# Patient Record
Sex: Female | Born: 1975 | Race: Black or African American | Hispanic: No | Marital: Single | Smoking: Current every day smoker
Health system: Southern US, Community
[De-identification: ages and names within clinical notes are randomized; demographics above are authoritative.]

## PROBLEM LIST (undated history)

## (undated) DIAGNOSIS — Z789 Other specified health status: Secondary | ICD-10-CM

## (undated) HISTORY — PX: OTHER SURGICAL HISTORY: SHX169

## (undated) HISTORY — PX: TUBAL LIGATION: SHX77

---

## 1998-01-02 ENCOUNTER — Ambulatory Visit (HOSPITAL_COMMUNITY): Admission: RE | Admit: 1998-01-02 | Discharge: 1998-01-02 | Payer: Self-pay | Admitting: Obstetrics

## 1998-01-31 ENCOUNTER — Ambulatory Visit (HOSPITAL_COMMUNITY): Admission: RE | Admit: 1998-01-31 | Discharge: 1998-01-31 | Payer: Self-pay | Admitting: Obstetrics

## 1998-03-26 ENCOUNTER — Other Ambulatory Visit: Admission: RE | Admit: 1998-03-26 | Discharge: 1998-03-26 | Payer: Self-pay | Admitting: Obstetrics

## 1998-05-02 ENCOUNTER — Inpatient Hospital Stay (HOSPITAL_COMMUNITY): Admission: AD | Admit: 1998-05-02 | Discharge: 1998-05-04 | Payer: Self-pay | Admitting: Obstetrics

## 2000-07-23 ENCOUNTER — Encounter: Payer: Self-pay | Admitting: *Deleted

## 2000-07-23 ENCOUNTER — Emergency Department (HOSPITAL_COMMUNITY): Admission: EM | Admit: 2000-07-23 | Discharge: 2000-07-23 | Payer: Self-pay | Admitting: Emergency Medicine

## 2001-08-27 ENCOUNTER — Emergency Department (HOSPITAL_COMMUNITY): Admission: EM | Admit: 2001-08-27 | Discharge: 2001-08-27 | Payer: Self-pay | Admitting: Emergency Medicine

## 2004-03-13 ENCOUNTER — Inpatient Hospital Stay (HOSPITAL_COMMUNITY): Admission: AD | Admit: 2004-03-13 | Discharge: 2004-03-13 | Payer: Self-pay | Admitting: Obstetrics

## 2004-03-25 ENCOUNTER — Inpatient Hospital Stay (HOSPITAL_COMMUNITY): Admission: RE | Admit: 2004-03-25 | Discharge: 2004-03-25 | Payer: Self-pay | Admitting: Obstetrics & Gynecology

## 2004-04-09 ENCOUNTER — Ambulatory Visit (HOSPITAL_COMMUNITY): Admission: RE | Admit: 2004-04-09 | Discharge: 2004-04-09 | Payer: Self-pay | Admitting: *Deleted

## 2004-06-09 ENCOUNTER — Inpatient Hospital Stay (HOSPITAL_COMMUNITY): Admission: AD | Admit: 2004-06-09 | Discharge: 2004-06-09 | Payer: Self-pay | Admitting: Family Medicine

## 2004-10-10 ENCOUNTER — Inpatient Hospital Stay (HOSPITAL_COMMUNITY): Admission: AD | Admit: 2004-10-10 | Discharge: 2004-10-13 | Payer: Self-pay | Admitting: Obstetrics

## 2006-12-30 ENCOUNTER — Emergency Department (HOSPITAL_COMMUNITY): Admission: EM | Admit: 2006-12-30 | Discharge: 2006-12-30 | Payer: Self-pay | Admitting: Emergency Medicine

## 2010-11-30 ENCOUNTER — Encounter: Payer: Self-pay | Admitting: *Deleted

## 2011-03-20 ENCOUNTER — Emergency Department (HOSPITAL_COMMUNITY)
Admission: EM | Admit: 2011-03-20 | Discharge: 2011-03-20 | Disposition: A | Discharge status: Home / Self Care | Payer: Self-pay | Attending: Emergency Medicine | Admitting: Emergency Medicine

## 2011-03-20 DIAGNOSIS — R599 Enlarged lymph nodes, unspecified: Secondary | ICD-10-CM | POA: Insufficient documentation

## 2011-03-20 DIAGNOSIS — K029 Dental caries, unspecified: Secondary | ICD-10-CM | POA: Insufficient documentation

## 2011-03-20 DIAGNOSIS — R6884 Jaw pain: Secondary | ICD-10-CM | POA: Insufficient documentation

## 2011-03-20 DIAGNOSIS — K047 Periapical abscess without sinus: Secondary | ICD-10-CM | POA: Insufficient documentation

## 2011-03-20 DIAGNOSIS — K089 Disorder of teeth and supporting structures, unspecified: Secondary | ICD-10-CM | POA: Insufficient documentation

## 2011-03-20 DIAGNOSIS — R220 Localized swelling, mass and lump, head: Secondary | ICD-10-CM | POA: Insufficient documentation

## 2013-04-05 ENCOUNTER — Other Ambulatory Visit (HOSPITAL_COMMUNITY): Payer: Self-pay | Admitting: Obstetrics

## 2013-04-05 DIAGNOSIS — N912 Amenorrhea, unspecified: Secondary | ICD-10-CM

## 2013-04-05 DIAGNOSIS — Z1231 Encounter for screening mammogram for malignant neoplasm of breast: Secondary | ICD-10-CM

## 2013-04-20 ENCOUNTER — Ambulatory Visit (HOSPITAL_COMMUNITY)
Admission: RE | Admit: 2013-04-20 | Discharge: 2013-04-20 | Disposition: A | Discharge status: Home / Self Care | Payer: Medicaid Other | Source: Ambulatory Visit | Attending: Obstetrics | Admitting: Obstetrics

## 2013-04-20 DIAGNOSIS — N949 Unspecified condition associated with female genital organs and menstrual cycle: Secondary | ICD-10-CM | POA: Insufficient documentation

## 2013-04-20 DIAGNOSIS — D259 Leiomyoma of uterus, unspecified: Secondary | ICD-10-CM | POA: Insufficient documentation

## 2013-04-20 DIAGNOSIS — N912 Amenorrhea, unspecified: Secondary | ICD-10-CM | POA: Insufficient documentation

## 2013-04-20 DIAGNOSIS — N854 Malposition of uterus: Secondary | ICD-10-CM | POA: Insufficient documentation

## 2013-04-20 DIAGNOSIS — Z1231 Encounter for screening mammogram for malignant neoplasm of breast: Secondary | ICD-10-CM | POA: Insufficient documentation

## 2016-07-18 LAB — PROCEDURE REPORT - SCANNED: Pap: NEGATIVE

## 2019-02-27 ENCOUNTER — Other Ambulatory Visit: Payer: Self-pay

## 2019-02-27 ENCOUNTER — Ambulatory Visit (HOSPITAL_COMMUNITY)
Admission: EM | Admit: 2019-02-27 | Discharge: 2019-02-27 | Disposition: A | Discharge status: Home / Self Care | Payer: Self-pay | Attending: Family Medicine | Admitting: Family Medicine

## 2019-02-27 ENCOUNTER — Encounter (HOSPITAL_COMMUNITY): Payer: Self-pay

## 2019-02-27 DIAGNOSIS — K1120 Sialoadenitis, unspecified: Secondary | ICD-10-CM

## 2019-02-27 MED ORDER — CEPHALEXIN 500 MG PO CAPS: 500.0000 mg | ORAL_CAPSULE | Freq: Two times a day (BID) | ORAL | 0 refills | Status: DC

## 2019-02-27 NOTE — ED Triage Notes (Signed)
Pt states she has a swollen gland on the left side of her neck x 3 weeks. Pt states she has been taking tylenol and ibuprofen. and amoxcillon 500 mg  For 3 days.

## 2019-02-27 NOTE — ED Provider Notes (Signed)
[name]    CSN: [id number] Arrival date & time: 02/27/19  1545     History   Chief Complaint Chief Complaint  Patient presents with  . Facial Swelling    HPI Rachael [name] is a 43 y.o. female.   She is presenting with left submandibular swelling and pain.  This is been ongoing for 3 weeks.  Denies any fevers or chills.  Does have pain in this area.  Denies any trouble swallowing or breathing.  Has been taking some of her nieces amoxicillin with no improvement.  Has done this for the past 3 days.  No history of similar instances.  HPI  History reviewed. No pertinent past medical history.  There are no active problems to display for this patient.   History reviewed. No pertinent surgical history.  OB History   No obstetric history on file.      Home Medications    Prior to Admission medications   Medication Sig Start Date End Date Taking? Authorizing Provider  cephALEXin (KEFLEX) 500 MG capsule Take 1 capsule (500 mg total) by mouth 2 (two) times daily. 02/27/19   Rosemarie Ax, MD    Family History Family History  Problem Relation Age of Onset  . Stroke Mother   . Hypertension Mother   . Cancer Father   . Hypertension Father     Social History Social History   Tobacco Use  . Smoking status: Current Every Day Smoker    Types: Cigarettes  . Smokeless tobacco: Never Used  Substance Use Topics  . Alcohol use: Never    Frequency: Never  . Drug use: Never     Allergies   Patient has no known allergies.   Review of Systems Review of Systems  Constitutional: Negative for fever.  HENT: Positive for facial swelling. Negative for congestion.   Respiratory: Negative for shortness of breath.   Cardiovascular: Negative for chest pain.  Gastrointestinal: Negative for abdominal pain.  Musculoskeletal: Negative for back pain.  Skin: Negative for color change.  Neurological: Negative for weakness.  Hematological: Negative for adenopathy.     Physical Exam Triage Vital Signs ED Triage Vitals  Enc Vitals Group     BP 02/27/19 1603 103/64     Pulse Rate 02/27/19 1603 75     Resp 02/27/19 1603 16     Temp 02/27/19 1603 98.7 F (37.1 C)     Temp Source 02/27/19 1603 Oral     SpO2 02/27/19 1603 100 %     Weight 02/27/19 1601 115 lb (52.2 kg)     Height --      Head Circumference --      Peak Flow --      Pain Score 02/27/19 1600 8     Pain Loc --      Pain Edu? --      Excl. in [place]? --    No data found.  Updated Vital Signs BP 103/64 (BP Location: Right Arm)   Pulse 75   Temp 98.7 F (37.1 C) (Oral)   Resp 16   Wt 52.2 kg   SpO2 100%   Visual Acuity Right Eye Distance:   Left Eye Distance:   Bilateral Distance:    Right Eye Near:   Left Eye Near:    Bilateral Near:     Physical Exam Gen: NAD, alert, cooperative with exam, well-appearing ENT: normal lips, normal nasal mucosa, tenderness to palpation over the left submandibular gland with fullness.  No  expression of pus or fluid.  No overlying streaking or redness. Eye: normal EOM, normal conjunctiva and lids CV:  no edema, +2 pedal pulses   Resp: no accessory muscle use, non-labored,  Skin: no rashes, no areas of induration  Neuro: normal tone, normal sensation to touch Psych:  normal insight, alert and oriented MSK: normal gait, normal strength    UC Treatments / Results  Labs (all labs ordered are listed, but only abnormal results are displayed) Labs Reviewed - No data to display  EKG None  Radiology No results found.  Procedures Procedures (including critical care time)  Medications Ordered in UC Medications - No data to display  Initial Impression / Assessment and Plan / UC Course  I have reviewed the triage vital signs and the nursing notes.  Pertinent labs & imaging results that were available during my care of the patient were reviewed by me and considered in my medical decision making (see chart for details).     [name] is a  43 year old female that is presenting with left sialadenitis.  Will provide with Keflex.  Counseled on supportive care.  Given indications to follow-up and return.  Final Clinical Impressions(s) / UC Diagnoses   Final diagnoses:  Sialadenitis     Discharge Instructions     Please take the antibiotic  Please follow up if your symptoms fail to improve.     ED Prescriptions    Medication Sig Dispense Auth. Provider   cephALEXin (KEFLEX) 500 MG capsule Take 1 capsule (500 mg total) by mouth 2 (two) times daily. 14 capsule Rosemarie Ax, MD     Controlled Substance Prescriptions [place] Controlled Substance Registry consulted? Not Applicable   Rosemarie Ax, MD 02/27/19 [phone]

## 2019-02-27 NOTE — Discharge Instructions (Signed)
Please take the antibiotic  Please follow up if your symptoms fail to improve.

## 2019-03-06 ENCOUNTER — Telehealth (HOSPITAL_COMMUNITY): Payer: Self-pay | Admitting: Emergency Medicine

## 2019-03-06 ENCOUNTER — Ambulatory Visit (HOSPITAL_COMMUNITY)
Admission: EM | Admit: 2019-03-06 | Discharge: 2019-03-06 | Disposition: A | Discharge status: Home / Self Care | Payer: Self-pay | Attending: Family Medicine | Admitting: Family Medicine

## 2019-03-06 ENCOUNTER — Encounter (HOSPITAL_COMMUNITY): Payer: Self-pay

## 2019-03-06 ENCOUNTER — Other Ambulatory Visit: Payer: Self-pay

## 2019-03-06 DIAGNOSIS — R220 Localized swelling, mass and lump, head: Secondary | ICD-10-CM | POA: Insufficient documentation

## 2019-03-06 LAB — CBC WITH DIFFERENTIAL/PLATELET
Abs Immature Granulocytes: 0.01 10*3/uL (ref 0.00–0.07)
Basophils Absolute: 0.1 10*3/uL (ref 0.0–0.1)
Basophils Relative: 1 %
Eosinophils Absolute: 0.1 10*3/uL (ref 0.0–0.5)
Eosinophils Relative: 2 %
HCT: 38.5 % (ref 36.0–46.0)
Hemoglobin: 12.6 g/dL (ref 12.0–15.0)
Immature Granulocytes: 0 %
Lymphocytes Relative: 32 %
Lymphs Abs: 1.7 10*3/uL (ref 0.7–4.0)
MCH: 27.8 pg (ref 26.0–34.0)
MCHC: 32.7 g/dL (ref 30.0–36.0)
MCV: 84.8 fL (ref 80.0–100.0)
Monocytes Absolute: 0.4 10*3/uL (ref 0.1–1.0)
Monocytes Relative: 8 %
Neutro Abs: 2.9 10*3/uL (ref 1.7–7.7)
Neutrophils Relative %: 57 %
Platelets: 211 10*3/uL (ref 150–400)
RBC: 4.54 MIL/uL (ref 3.87–5.11)
RDW: 15.9 % — ABNORMAL HIGH (ref 11.5–15.5)
WBC: 5.2 10*3/uL (ref 4.0–10.5)
nRBC: 0 % (ref 0.0–0.2)

## 2019-03-06 NOTE — Telephone Encounter (Signed)
Normal labs. Attempted to reach patient. No answer at this time.

## 2019-03-06 NOTE — Telephone Encounter (Signed)
Patient contacted and made aware of all results, all questions answered.   

## 2019-03-06 NOTE — ED Triage Notes (Signed)
Pt cc she is still having neck pain. Pt states she has been taking her Cephalexin. Pt states it's not working.

## 2019-03-06 NOTE — ED Provider Notes (Signed)
[place]   [id number] 03/06/19 Arrival Time: 9833  ASSESSMENT & PLAN:  1. Mass of left submandibular region    Recommend: Follow-up Information    [facility].   Contact information: [place] [address] [phone]          Pending: Labs Reviewed  CBC WITH DIFFERENTIAL/PLATELET  Will notify her of any significant results.  OTC symptom care as needed. May f/u with PCP or here as needed.  Reviewed expectations re: course of current medical issues. Questions answered. Outlined signs and symptoms indicating need for more acute intervention. Patient verbalized understanding. After Visit Summary given.   SUBJECTIVE: History from: patient.  Rachael [name] is a 43 y.o. female who was seen here on 02/27/2019. Placed on Keflex for suspected sialadenitis. Questions mild help initially but "now it really hasn't changed". L-sided. No specific enlargement of area. Is tender. Afebrile. No swallowing or speaking difficulties. No specific aggravating or alleviating factors reported. Normal appetite and PO intake without n/v. Weight stable. Otherwise feeling well. No PMH of similar. Overall she feels this has been present for at least 1.5 months. No OTC treatment.  Social History   Tobacco Use  Smoking Status Current Every Day Smoker  . Types: Cigarettes  Smokeless Tobacco Never Used   ROS: As per HPI. All other systems negative.    OBJECTIVE:  Vitals:   03/06/19 0902 03/06/19 0903  BP:  (!) 140/97  Pulse:  83  Resp:  16  Temp:  98.5 F (36.9 C)  TempSrc:  Oral  SpO2:  98%  Weight: 52.2 kg     General appearance: alert; no distress HEENT: oropharynx normal Neck: supple with FROM; L submandibular soft/rubbery mass measuring approx 1-1.5 cm by < 1 cm; tenderness to palpation reported CV: RRR Lungs: unlabored respirations, symmetrical air entry without wheezing; cough: absent Abd: soft Ext: no LE  edema Skin: warm and dry Psychological: alert and cooperative; normal mood and affect  No Known Allergies  Family History  Problem Relation Age of Onset  . Stroke Mother   . Hypertension Mother   . Cancer Father   . Hypertension Father    Social History   Socioeconomic History  . Marital status: Single    Spouse name: Not on file  . Number of children: Not on file  . Years of education: Not on file  . Highest education level: Not on file  Occupational History  . Not on file  Social Needs  . Financial resource strain: Not on file  . Food insecurity:    Worry: Not on file    Inability: Not on file  . Transportation needs:    Medical: Not on file    Non-medical: Not on file  Tobacco Use  . Smoking status: Current Every Day Smoker    Types: Cigarettes  . Smokeless tobacco: Never Used  Substance and Sexual Activity  . Alcohol use: Never    Frequency: Never  . Drug use: Never  . Sexual activity: Yes  Lifestyle  . Physical activity:    Days per week: Not on file    Minutes per session: Not on file  . Stress: Not on file  Relationships  . Social connections:    Talks on phone: Not on file    Gets together: Not on file    Attends religious service: Not on file    Active member of club or organization: Not on file  Attends meetings of clubs or organizations: Not on file    Relationship status: Not on file  . Intimate partner violence:    Fear of current or ex partner: Not on file    Emotionally abused: Not on file    Physically abused: Not on file    Forced sexual activity: Not on file  Other Topics Concern  . Not on file  Social History Narrative  . Not on file           Vanessa Kick, MD 03/06/19 [phone]

## 2019-03-07 ENCOUNTER — Other Ambulatory Visit: Payer: Self-pay | Admitting: Otolaryngology

## 2019-03-07 DIAGNOSIS — K118 Other diseases of salivary glands: Secondary | ICD-10-CM

## 2019-03-07 DIAGNOSIS — R6884 Jaw pain: Principal | ICD-10-CM

## 2019-03-13 ENCOUNTER — Ambulatory Visit
Admission: RE | Admit: 2019-03-13 | Discharge: 2019-03-13 | Disposition: A | Discharge status: Home / Self Care | Payer: No Typology Code available for payment source | Source: Ambulatory Visit | Attending: Otolaryngology | Admitting: Otolaryngology

## 2019-03-13 ENCOUNTER — Other Ambulatory Visit: Payer: Self-pay

## 2019-03-13 DIAGNOSIS — R6884 Jaw pain: Principal | ICD-10-CM

## 2019-03-13 DIAGNOSIS — K118 Other diseases of salivary glands: Secondary | ICD-10-CM

## 2019-03-13 MED ORDER — IOPAMIDOL (ISOVUE-300) INJECTION 61%
75.0000 mL | Freq: Once | INTRAVENOUS | Status: AC | PRN
  Administered 2019-03-13: 75 mL via INTRAVENOUS

## 2019-03-17 ENCOUNTER — Other Ambulatory Visit: Payer: Self-pay

## 2019-03-17 ENCOUNTER — Other Ambulatory Visit: Payer: Self-pay | Admitting: Otolaryngology

## 2019-03-17 ENCOUNTER — Encounter (HOSPITAL_COMMUNITY): Payer: Self-pay | Admitting: *Deleted

## 2019-03-17 NOTE — Progress Notes (Addendum)
Spoke with pt for pre-op call. Pt denies cardiac history, HTN or Diabetes.    Coronavirus Screening  Have you experienced the following symptoms:  Cough NO Fever (>100.23F) NO Runny nose NO Sore throat NO Difficulty breathing/shortness of breath  NO  Have you or a family member traveled in the last 14 days and where? NO     Patient reminded that hospital visitation restrictions are in effect and the importance of the restrictions.

## 2019-03-20 ENCOUNTER — Ambulatory Visit (HOSPITAL_COMMUNITY)
Admission: RE | Admit: 2019-03-20 | Discharge: 2019-03-20 | Disposition: A | Discharge status: Home / Self Care | Payer: Self-pay | Attending: Otolaryngology | Admitting: Otolaryngology

## 2019-03-20 ENCOUNTER — Encounter (HOSPITAL_COMMUNITY)
Admission: RE | Disposition: A | Discharge status: Home / Self Care | Payer: Self-pay | Source: Home / Self Care | Attending: Otolaryngology

## 2019-03-20 ENCOUNTER — Encounter (HOSPITAL_COMMUNITY): Payer: Self-pay

## 2019-03-20 ENCOUNTER — Other Ambulatory Visit: Payer: Self-pay

## 2019-03-20 ENCOUNTER — Ambulatory Visit (HOSPITAL_COMMUNITY): Payer: Self-pay | Admitting: Anesthesiology

## 2019-03-20 DIAGNOSIS — Z1159 Encounter for screening for other viral diseases: Secondary | ICD-10-CM | POA: Insufficient documentation

## 2019-03-20 DIAGNOSIS — F17210 Nicotine dependence, cigarettes, uncomplicated: Secondary | ICD-10-CM | POA: Insufficient documentation

## 2019-03-20 DIAGNOSIS — Z791 Long term (current) use of non-steroidal anti-inflammatories (NSAID): Secondary | ICD-10-CM | POA: Insufficient documentation

## 2019-03-20 DIAGNOSIS — K118 Other diseases of salivary glands: Secondary | ICD-10-CM | POA: Insufficient documentation

## 2019-03-20 DIAGNOSIS — Z823 Family history of stroke: Secondary | ICD-10-CM | POA: Insufficient documentation

## 2019-03-20 DIAGNOSIS — Z8249 Family history of ischemic heart disease and other diseases of the circulatory system: Secondary | ICD-10-CM | POA: Insufficient documentation

## 2019-03-20 DIAGNOSIS — Z809 Family history of malignant neoplasm, unspecified: Secondary | ICD-10-CM | POA: Insufficient documentation

## 2019-03-20 HISTORY — DX: Other specified health status: Z78.9

## 2019-03-20 HISTORY — PX: SUBMANDIBULAR GLAND EXCISION: SHX2456

## 2019-03-20 LAB — SARS CORONAVIRUS 2 BY RT PCR (HOSPITAL ORDER, PERFORMED IN ~~LOC~~ HOSPITAL LAB): SARS Coronavirus 2: NEGATIVE

## 2019-03-20 SURGERY — EXCISION, SUBMANDIBULAR GLAND
Anesthesia: General | Site: Neck | Laterality: Left

## 2019-03-20 MED ORDER — MIDAZOLAM HCL 2 MG/2ML IJ SOLN
INTRAMUSCULAR | Status: AC
  Filled 2019-03-20: qty 2

## 2019-03-20 MED ORDER — LACTATED RINGERS IV SOLN
INTRAVENOUS | Status: DC
  Administered 2019-03-20: 09:00:00 via INTRAVENOUS

## 2019-03-20 MED ORDER — OXYCODONE HCL 5 MG/5ML PO SOLN: 5.0000 mg | Freq: Once | ORAL | Status: AC | PRN

## 2019-03-20 MED ORDER — CEFAZOLIN SODIUM-DEXTROSE 2-3 GM-%(50ML) IV SOLR
INTRAVENOUS | Status: DC | PRN
  Administered 2019-03-20: 2 g via INTRAVENOUS

## 2019-03-20 MED ORDER — HYDROCODONE-ACETAMINOPHEN 5-325 MG PO TABS: 1.0000 | ORAL_TABLET | Freq: Four times a day (QID) | ORAL | 0 refills | Status: AC | PRN

## 2019-03-20 MED ORDER — LIDOCAINE-EPINEPHRINE 1 %-1:100000 IJ SOLN
INTRAMUSCULAR | Status: DC | PRN
  Administered 2019-03-20: 3 mL

## 2019-03-20 MED ORDER — FENTANYL CITRATE (PF) 250 MCG/5ML IJ SOLN
INTRAMUSCULAR | Status: DC | PRN
  Administered 2019-03-20 (×3): 50 ug via INTRAVENOUS

## 2019-03-20 MED ORDER — OXYMETAZOLINE HCL 0.05 % NA SOLN
NASAL | Status: AC
  Filled 2019-03-20: qty 30

## 2019-03-20 MED ORDER — ROCURONIUM BROMIDE 10 MG/ML (PF) SYRINGE
PREFILLED_SYRINGE | INTRAVENOUS | Status: AC
  Filled 2019-03-20: qty 10

## 2019-03-20 MED ORDER — ONDANSETRON HCL 4 MG/2ML IJ SOLN: 4.0000 mg | Freq: Once | INTRAMUSCULAR | Status: DC | PRN

## 2019-03-20 MED ORDER — LIDOCAINE-EPINEPHRINE 1 %-1:100000 IJ SOLN
INTRAMUSCULAR | Status: AC
  Filled 2019-03-20: qty 1

## 2019-03-20 MED ORDER — FENTANYL CITRATE (PF) 100 MCG/2ML IJ SOLN
INTRAMUSCULAR | Status: AC
  Filled 2019-03-20: qty 2

## 2019-03-20 MED ORDER — SUCCINYLCHOLINE CHLORIDE 200 MG/10ML IV SOSY
PREFILLED_SYRINGE | INTRAVENOUS | Status: AC
  Filled 2019-03-20: qty 10

## 2019-03-20 MED ORDER — BACITRACIN ZINC 500 UNIT/GM EX OINT
TOPICAL_OINTMENT | CUTANEOUS | Status: AC
  Filled 2019-03-20: qty 28.35

## 2019-03-20 MED ORDER — ONDANSETRON HCL 4 MG/2ML IJ SOLN
INTRAMUSCULAR | Status: AC
  Filled 2019-03-20: qty 2

## 2019-03-20 MED ORDER — 0.9 % SODIUM CHLORIDE (POUR BTL) OPTIME
TOPICAL | Status: DC | PRN
  Administered 2019-03-20: 1000 mL

## 2019-03-20 MED ORDER — ONDANSETRON HCL 4 MG/2ML IJ SOLN
INTRAMUSCULAR | Status: DC | PRN
  Administered 2019-03-20: 4 mg via INTRAVENOUS

## 2019-03-20 MED ORDER — EPINEPHRINE PF 1 MG/ML IJ SOLN
INTRAMUSCULAR | Status: AC
  Filled 2019-03-20: qty 1

## 2019-03-20 MED ORDER — MIDAZOLAM HCL 2 MG/2ML IJ SOLN
INTRAMUSCULAR | Status: DC | PRN
  Administered 2019-03-20 (×2): 1 mg via INTRAVENOUS

## 2019-03-20 MED ORDER — DEXAMETHASONE SODIUM PHOSPHATE 10 MG/ML IJ SOLN
INTRAMUSCULAR | Status: AC
  Filled 2019-03-20: qty 1

## 2019-03-20 MED ORDER — FENTANYL CITRATE (PF) 250 MCG/5ML IJ SOLN
INTRAMUSCULAR | Status: AC
  Filled 2019-03-20: qty 5

## 2019-03-20 MED ORDER — PROPOFOL 10 MG/ML IV BOLUS
INTRAVENOUS | Status: AC
  Filled 2019-03-20: qty 20

## 2019-03-20 MED ORDER — LIDOCAINE 2% (20 MG/ML) 5 ML SYRINGE
INTRAMUSCULAR | Status: DC | PRN
  Administered 2019-03-20: 60 mg via INTRAVENOUS

## 2019-03-20 MED ORDER — LIDOCAINE 2% (20 MG/ML) 5 ML SYRINGE
INTRAMUSCULAR | Status: AC
  Filled 2019-03-20: qty 5

## 2019-03-20 MED ORDER — SUCCINYLCHOLINE CHLORIDE 20 MG/ML IJ SOLN
INTRAMUSCULAR | Status: DC | PRN
  Administered 2019-03-20: 60 mg via INTRAVENOUS

## 2019-03-20 MED ORDER — OXYCODONE HCL 5 MG PO TABS
5.0000 mg | ORAL_TABLET | Freq: Once | ORAL | Status: AC | PRN
  Administered 2019-03-20: 5 mg via ORAL

## 2019-03-20 MED ORDER — OXYCODONE HCL 5 MG PO TABS
ORAL_TABLET | ORAL | Status: AC
  Filled 2019-03-20: qty 1

## 2019-03-20 MED ORDER — FENTANYL CITRATE (PF) 100 MCG/2ML IJ SOLN
25.0000 ug | INTRAMUSCULAR | Status: DC | PRN
  Administered 2019-03-20 (×3): 25 ug via INTRAVENOUS

## 2019-03-20 MED ORDER — DEXAMETHASONE SODIUM PHOSPHATE 10 MG/ML IJ SOLN
INTRAMUSCULAR | Status: DC | PRN
  Administered 2019-03-20: 10 mg via INTRAVENOUS

## 2019-03-20 MED ORDER — PROPOFOL 10 MG/ML IV BOLUS
INTRAVENOUS | Status: DC | PRN
  Administered 2019-03-20: 100 mg via INTRAVENOUS

## 2019-03-20 SURGICAL SUPPLY — 30 items
ADH SKN CLS APL DERMABOND .7 (GAUZE/BANDAGES/DRESSINGS) ×2
CANISTER SUCT 3000ML PPV (MISCELLANEOUS) ×8 IMPLANT
CONT SPEC 4OZ CLIKSEAL STRL BL (MISCELLANEOUS) ×4 IMPLANT
COVER SURGICAL LIGHT HANDLE (MISCELLANEOUS) ×8 IMPLANT
COVER WAND RF STERILE (DRAPES) ×5 IMPLANT
CRADLE DONUT ADULT HEAD (MISCELLANEOUS) ×3 IMPLANT
DERMABOND ADVANCED (GAUZE/BANDAGES/DRESSINGS) ×2
DERMABOND ADVANCED .7 DNX12 (GAUZE/BANDAGES/DRESSINGS) ×2 IMPLANT
ELECT COATED BLADE 2.86 ST (ELECTRODE) ×5 IMPLANT
ELECT REM PT RETURN 9FT ADLT (ELECTROSURGICAL) ×4
ELECTRODE REM PT RTRN 9FT ADLT (ELECTROSURGICAL) ×3 IMPLANT
GLOVE BIO SURGEON STRL SZ7.5 (GLOVE) ×9 IMPLANT
GOWN STRL REUS W/ TWL LRG LVL3 (GOWN DISPOSABLE) ×9 IMPLANT
GOWN STRL REUS W/TWL LRG LVL3 (GOWN DISPOSABLE) ×16
KIT BASIN OR (CUSTOM PROCEDURE TRAY) ×5 IMPLANT
KIT TURNOVER KIT B (KITS) ×5 IMPLANT
NDL HYPO 25GX1X1/2 BEV (NEEDLE) IMPLANT
NEEDLE HYPO 25GX1X1/2 BEV (NEEDLE) ×4 IMPLANT
NS IRRIG 1000ML POUR BTL (IV SOLUTION) ×6 IMPLANT
PAD ARMBOARD 7.5X6 YLW CONV (MISCELLANEOUS) ×10 IMPLANT
PENCIL BUTTON HOLSTER BLD 10FT (ELECTRODE) ×5 IMPLANT
SPECIMEN JAR SMALL (MISCELLANEOUS) ×4 IMPLANT
SUT SILK 3 0 (SUTURE) ×4
SUT SILK 3-0 18XBRD TIE 12 (SUTURE) ×1 IMPLANT
SUT VIC AB 3-0 SH 27 (SUTURE) ×4
SUT VIC AB 3-0 SH 27X BRD (SUTURE) ×1 IMPLANT
SUT VICRYL 4-0 PS2 18IN ABS (SUTURE) ×3 IMPLANT
TOWEL GREEN STERILE FF (TOWEL DISPOSABLE) ×6 IMPLANT
TRAY ENT MC OR (CUSTOM PROCEDURE TRAY) ×5 IMPLANT
WATER STERILE IRR 1000ML POUR (IV SOLUTION) ×6 IMPLANT

## 2019-03-20 NOTE — H&P (Signed)
Rachael [name] is an 43 y.o. female.   Chief Complaint: Left submandibular pain HPI: 43 year old female with pain in left submandibular gland for the past two months.  There has not been much swelling.  Antibiotics were not helpful.  Imaging demonstrated only a small peripheral calculus.  She presents for surgical management.  Past Medical History:  Diagnosis Date  . Medical history non-contributory     Past Surgical History:  Procedure Laterality Date  . c-section    . TUBAL LIGATION      Family History  Problem Relation Age of Onset  . Stroke Mother   . Hypertension Mother   . Cancer Father   . Hypertension Father    Social History:  reports that she has been smoking cigarettes. She has never used smokeless tobacco. She reports that she does not drink alcohol or use drugs.  Allergies: No Known Allergies  Medications Prior to Admission  Medication Sig Dispense Refill  . ibuprofen (ADVIL) 200 MG tablet Take 600 mg by mouth every 4 (four) hours as needed for moderate pain.    . traMADol (ULTRAM) 50 MG tablet Take 50-100 mg by mouth every 6 (six) hours as needed for severe pain.    . cephALEXin (KEFLEX) 500 MG capsule Take 1 capsule (500 mg total) by mouth 2 (two) times daily. (Patient not taking: Reported on 03/17/2019) 14 capsule 0    Results for orders placed or performed during the hospital encounter of 03/20/19 (from the past 48 hour(s))  SARS Coronavirus 2 (CEPHEID - Performed in [place] hospital lab), Hosp Order     Status: None   Collection Time: 03/20/19  8:08 AM  Result Value Ref Range   SARS Coronavirus 2 NEGATIVE NEGATIVE    Comment: (NOTE) If result is NEGATIVE SARS-CoV-2 target nucleic acids are NOT DETECTED. The SARS-CoV-2 RNA is generally detectable in upper and lower  respiratory specimens during the acute phase of infection. The lowest  concentration of SARS-CoV-2 viral copies this assay can detect is 250  copies / mL. A negative result does not preclude  SARS-CoV-2 infection  and should not be used as the sole basis for treatment or other  patient management decisions.  A negative result may occur with  improper specimen collection / handling, submission of specimen other  than nasopharyngeal swab, presence of viral mutation(s) within the  areas targeted by this assay, and inadequate number of viral copies  (<250 copies / mL). A negative result must be combined with clinical  observations, patient history, and epidemiological information. If result is POSITIVE SARS-CoV-2 target nucleic acids are DETECTED. The SARS-CoV-2 RNA is generally detectable in upper and lower  respiratory specimens dur ing the acute phase of infection.  Positive  results are indicative of active infection with SARS-CoV-2.  Clinical  correlation with patient history and other diagnostic information is  necessary to determine patient infection status.  Positive results do  not rule out bacterial infection or co-infection with other viruses. If result is PRESUMPTIVE POSTIVE SARS-CoV-2 nucleic acids MAY BE PRESENT.   A presumptive positive result was obtained on the submitted specimen  and confirmed on repeat testing.  While 2019 novel coronavirus  (SARS-CoV-2) nucleic acids may be present in the submitted sample  additional confirmatory testing may be necessary for epidemiological  and / or clinical management purposes  to differentiate between  SARS-CoV-2 and other Sarbecovirus currently known to infect humans.  If clinically indicated additional testing with an alternate test  methodology (  [id number]) is advised. The SARS-CoV-2 RNA is generally  detectable in upper and lower respiratory sp ecimens during the acute  phase of infection. The expected result is Negative. Fact Sheet for Patients:  StrictlyIdeas.no Fact Sheet for Healthcare Providers: BankingDealers.co.za This test is not yet approved or cleared by the  [place] FDA and has been authorized for detection and/or diagnosis of SARS-CoV-2 by FDA under an Emergency Use Authorization (EUA).  This EUA will remain in effect (meaning this test can be used) for the duration of the COVID-19 declaration under Section 564(b)(1) of the Act, 21 U.S.C. section 360bbb-3(b)(1), unless the authorization is terminated or revoked sooner. Performed at [facility], [place] [address], [place], [place] [address]    No results found.  Review of Systems  Musculoskeletal: Positive for neck pain.  All other systems reviewed and are negative.   Blood pressure (!) 178/90, pulse 67, temperature 98.3 F (36.8 C), temperature source Oral, resp. rate 18, height 5\' 4"  (1.626 m), weight 52.2 kg, SpO2 100 %. Physical Exam  Constitutional: She is oriented to person, place, and time. She appears well-developed and well-nourished. No distress.  HENT:  Head: Normocephalic and atraumatic.  Right Ear: External ear normal.  Left Ear: External ear normal.  Nose: Nose normal.  Mouth/Throat: Oropharynx is clear and moist.  Eyes: Pupils are equal, round, and reactive to light. Conjunctivae and EOM are normal.  Neck: Normal range of motion. Neck supple.  Left submandibular gland tender.  Cardiovascular: Normal rate.  Respiratory: Effort normal.  Neurological: She is alert and oriented to person, place, and time. No cranial nerve deficit.  Skin: Skin is warm and dry.  Psychiatric: She has a normal mood and affect. Her behavior is normal. Judgment and thought content normal.     Assessment/Plan Left submandibular gland pain.  To OR for left submandibular gland resection.  Melida Quitter, MD 03/20/2019, 12:08 PM

## 2019-03-20 NOTE — Transfer of Care (Signed)
Immediate Anesthesia Transfer of Care Note  Patient: Rachael [name]  Procedure(s) Performed: Left Submandibular Gland Excision (Left Neck)  Patient Location: PACU  Anesthesia Type:General  Level of Consciousness: awake, alert , oriented and patient cooperative  Airway & Oxygen Therapy: Patient Spontanous Breathing  Post-op Assessment: Report given to RN, Post -op Vital signs reviewed and stable and Patient moving all extremities X 4  Post vital signs: Reviewed and stable  Last Vitals:  Vitals Value Taken Time  BP 155/88 03/20/2019  1:24 PM  Temp 36.3 C 03/20/2019  1:24 PM  Pulse 65 03/20/2019  1:27 PM  Resp 14 03/20/2019  1:27 PM  SpO2 99 % 03/20/2019  1:27 PM  Vitals shown include unvalidated device data.  Last Pain:  Vitals:   03/20/19 0826  TempSrc:   PainSc: 3       Patients Stated Pain Goal: 3 (38/18/29 9371)  Complications: No apparent anesthesia complications

## 2019-03-20 NOTE — Brief Op Note (Signed)
03/20/2019  1:15 PM  PATIENT:  Rachael [name]  43 y.o. female  PRE-OPERATIVE DIAGNOSIS:  salivary stone, pain  POST-OPERATIVE DIAGNOSIS:  salivary stone, pain  PROCEDURE:  Procedure(s): Left Submandibular Gland Excision (Left)  SURGEON:  Surgeon(s) and Role:    Melida Quitter, MD - Primary  PHYSICIAN ASSISTANT:   ASSISTANTS: none   ANESTHESIA:   general  EBL:  10 mL   BLOOD ADMINISTERED:none  DRAINS: none   LOCAL MEDICATIONS USED:  LIDOCAINE   SPECIMEN:  Source of Specimen:  Left submandibular gland  DISPOSITION OF SPECIMEN:  PATHOLOGY  COUNTS:  YES  TOURNIQUET:  * No tourniquets in log *  DICTATION: .Other Dictation: Dictation Number [id number]  PLAN OF CARE: Discharge to home after PACU  PATIENT DISPOSITION:  PACU - hemodynamically stable.   Delay start of Pharmacological VTE agent (>24hrs) due to surgical blood loss or risk of bleeding: yes

## 2019-03-20 NOTE — Op Note (Signed)
NAMESHUNTA, [name] MEDICAL RECORD [id number] ACCOUNT [id number] DATE OF BIRTH:08-01-1976 FACILITY: [facility] LOCATION: [facility] PHYSICIAN:Cartel Mauss DRedmond Baseman, MD  OPERATIVE REPORT  DATE OF PROCEDURE:  03/20/2019  PREOPERATIVE DIAGNOSIS:  Left submandibular gland pain.  POSTOPERATIVE DIAGNOSIS:  Left submandibular gland pain.  PROCEDURE:  Excision of left submandibular gland.  SURGEON:  Melida Quitter, MD  ANESTHESIA:  General endotracheal anesthesia.  COMPLICATIONS:  None.  INDICATIONS:  The patient is a 43 year old female with 2 months of left submandibular gland pain that has not responded to antibiotics and required prescription pain medicine.  CT imaging demonstrates a peripheral calculus in the gland, but otherwise a  normal-looking gland.  She presents to the operating room for surgical management.  FINDINGS:  The gland was in normal position and normal size and texture.  It was sent for pathology.  DESCRIPTION OF PROCEDURE:  The patient was identified in the holding room, informed consent having been obtained including discussion of risks, benefits and alternatives.  The patient was brought to the operative suite and put on the operative table in  supine position.  Anesthesia was induced, and the patient was intubated by the anesthesia team without difficulty.  The patient was given intravenous antibiotics during the case.  The eyes were taped closed and the left neck was marked with a marking pen  and injected with 1% lidocaine with 1:100,000 epinephrine.  The left neck was prepped and draped in sterile fashion.  An incision was made through the skin using a 15-blade scalpel and through the subcutaneous and platysmal layers using Bovie  electrocautery.  Dissection then was carried straight to the inferior extent of the gland.  Soft tissues were then dissected from the periphery of the gland, staying on the surface of the gland.  Superiorly, some of the vascular connections were divided   and ligated.  The mylohyoid muscle was identified anteriorly and retracted anteriorly, exposing the floor of mouth structures.  The gland was then dissected free to the connections to the lingual nerve that was easily identified.  The connections were  then divided and ligated, as was the gland duct.  The gland was then removed and passed to nursing for pathology.  The area was copiously irrigated with saline.  Bleeding was controlled with Bovie electrocautery.  The patient was given a Valsalva, and no  additional bleeding was seen.  The platysmal layer was closed with 3-0 Vicryl suture in a simple running fashion.  The skin was closed in subcutaneous layer using 4-0 Vicryl suture in a simple interrupted fashion and on the skin using Dermabond.  The  patient was cleaned off and returned to anesthesia for wakeup.  She was extubated in the recovery room in stable condition.  LN/NUANCE  D:03/20/2019 T:03/20/2019 JOB:[id number]

## 2019-03-20 NOTE — Anesthesia Procedure Notes (Signed)
Procedure Name: Intubation Date/Time: 03/20/2019 12:31 PM Performed by: Julieta Bellini, CRNA Pre-anesthesia Checklist: Patient identified, Emergency Drugs available, Suction available and Patient being monitored Patient Re-evaluated:Patient Re-evaluated prior to induction Oxygen Delivery Method: Circle system utilized Preoxygenation: Pre-oxygenation with 100% oxygen Induction Type: IV induction and Rapid sequence Ventilation: Mask ventilation without difficulty Laryngoscope Size: Mac and 4 Grade View: Grade I Tube type: Oral Tube size: 7.0 mm Number of attempts: 2 Airway Equipment and Method: Stylet and Oral airway Placement Confirmation: ETT inserted through vocal cords under direct vision,  positive ETCO2 and breath sounds checked- equal and bilateral Secured at: 22 cm Tube secured with: Tape Dental Injury: Teeth and Oropharynx as per pre-operative assessment and Bloody posterior oropharynx  Comments: I originally DL with a MAC 3, the light flickered so I continued and then the light went out. I switched to a Mac 4 and upon insertion there was a scant amount of blood in the back of the throat.

## 2019-03-20 NOTE — Anesthesia Preprocedure Evaluation (Addendum)
Anesthesia Evaluation  Patient identified by MRN, date of birth, ID band Patient awake    Reviewed: Allergy & Precautions, NPO status , Patient's Chart, lab work & pertinent test results  History of Anesthesia Complications Negative for: history of anesthetic complications  Airway Mallampati: I  TM Distance: >3 FB Neck ROM: Full    Dental no notable dental hx.    Pulmonary neg pulmonary ROS, Current Smoker,    Pulmonary exam normal        Cardiovascular negative cardio ROS Normal cardiovascular exam     Neuro/Psych negative neurological ROS  negative psych ROS   GI/Hepatic negative GI ROS, Neg liver ROS,   Endo/Other  negative endocrine ROS  Renal/GU negative Renal ROS  negative genitourinary   Musculoskeletal negative musculoskeletal ROS (+)   Abdominal   Peds  Hematology negative hematology ROS (+)   Anesthesia Other Findings   Reproductive/Obstetrics                            Anesthesia Physical Anesthesia Plan  ASA: I  Anesthesia Plan: General   Post-op Pain Management:    Induction: Intravenous  PONV Risk Score and Plan: 2 and Ondansetron, Dexamethasone, Midazolam and Treatment may vary due to age or medical condition  Airway Management Planned: Oral ETT and Nasal ETT  Additional Equipment: None  Intra-op Plan:   Post-operative Plan: Extubation in OR  Informed Consent: I have reviewed the patients History and Physical, chart, labs and discussed the procedure including the risks, benefits and alternatives for the proposed anesthesia with the patient or authorized representative who has indicated his/her understanding and acceptance.     Dental advisory given  Plan Discussed with:   Anesthesia Plan Comments:        Anesthesia Quick Evaluation

## 2019-03-20 NOTE — Anesthesia Postprocedure Evaluation (Signed)
Anesthesia Post Note  Patient: Willia Genrich  Procedure(s) Performed: Left Submandibular Gland Excision (Left Neck)     Patient location during evaluation: PACU Anesthesia Type: General Level of consciousness: awake and alert Pain management: pain level controlled Vital Signs Assessment: post-procedure vital signs reviewed and stable Respiratory status: spontaneous breathing, nonlabored ventilation and respiratory function stable Cardiovascular status: blood pressure returned to baseline and stable Postop Assessment: no apparent nausea or vomiting Anesthetic complications: no    Last Vitals:  Vitals:   03/20/19 1355 03/20/19 1410  BP: (!) 170/87 (!) 164/91  Pulse: 70 64  Resp: 19 14  Temp:    SpO2: 99% 100%    Last Pain:  Vitals:   03/20/19 1418  TempSrc:   PainSc: 4                  Lidia Collum

## 2019-03-21 ENCOUNTER — Encounter (HOSPITAL_COMMUNITY): Payer: Self-pay | Admitting: Otolaryngology

## 2019-04-12 ENCOUNTER — Other Ambulatory Visit: Payer: Self-pay

## 2019-10-14 IMAGING — CT CT NECK WITH CONTRAST
2 of 4 series · 5 of 14 positions shown, 6 images · IV contrast (iopamidol)
Comparison: None.

CLINICAL DATA: Left submandibular pain and swelling for 1.5 months.

EXAM:
CT NECK WITH CONTRAST
TECHNIQUE: Multidetector CT imaging of the neck was performed using the
standard protocol following the bolus administration of intravenous
contrast.
CONTRAST:  75mL SCSQSM-PTT IOPAMIDOL (SCSQSM-PTT) INJECTION 61%

[Series 3: neck · axial · 0.46mm/px · z∈[-266,-186]mm · 2 of 116 slices shown]
[im 39/116  bone]
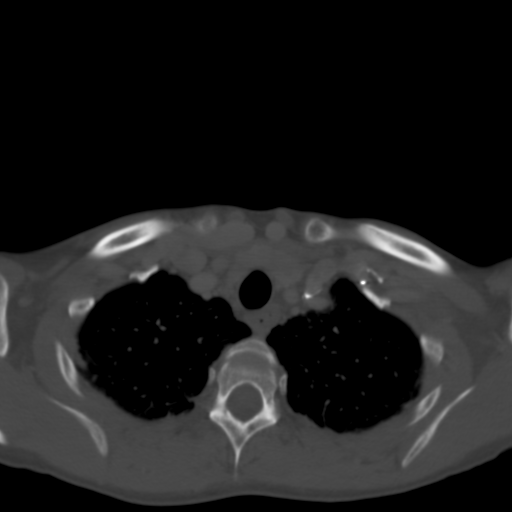
[im 77/116  bone]
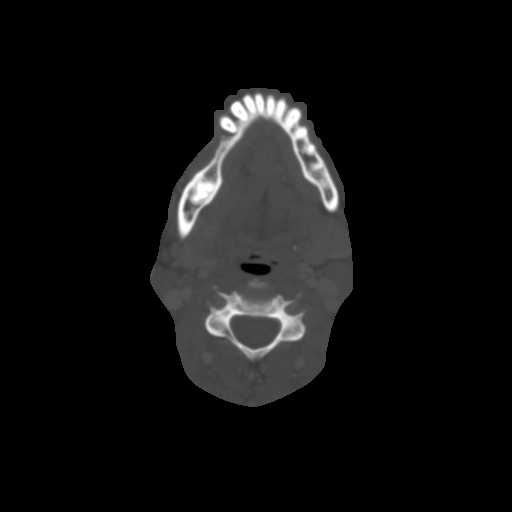

[Series 8: angled axial-oropharynx · axial · 0.45mm/px · z∈[-313,-192]mm · 3 of 126 slices shown, 4 images]
[im 32/126  soft-tissue]
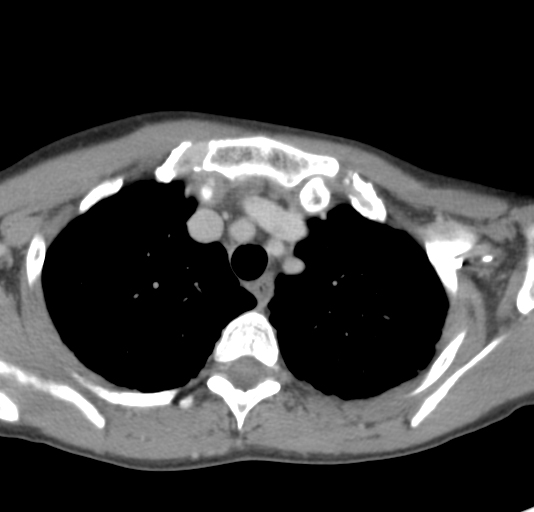
[im 32/126  bone]
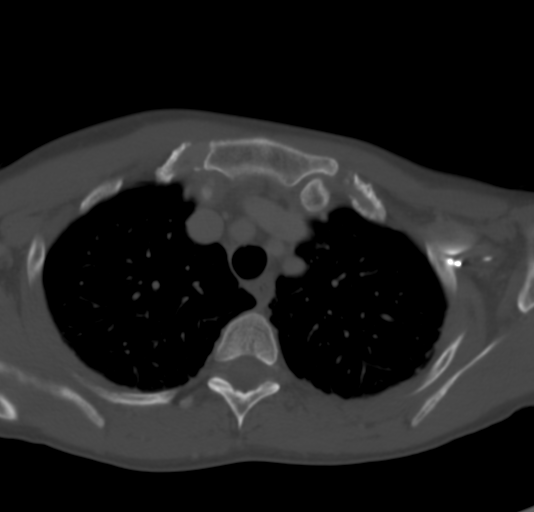
[im 63/126  bone]
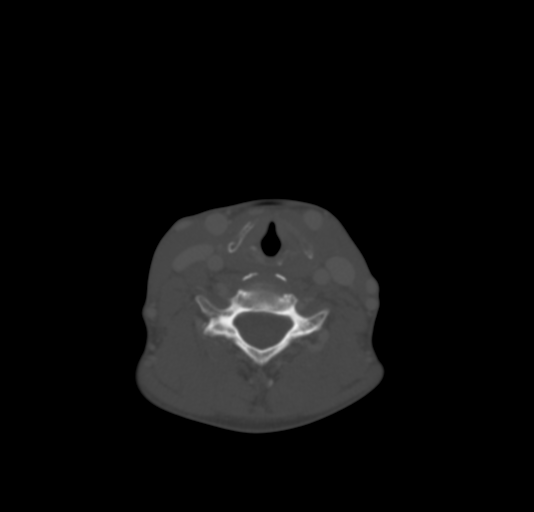
[im 94/126  bone]
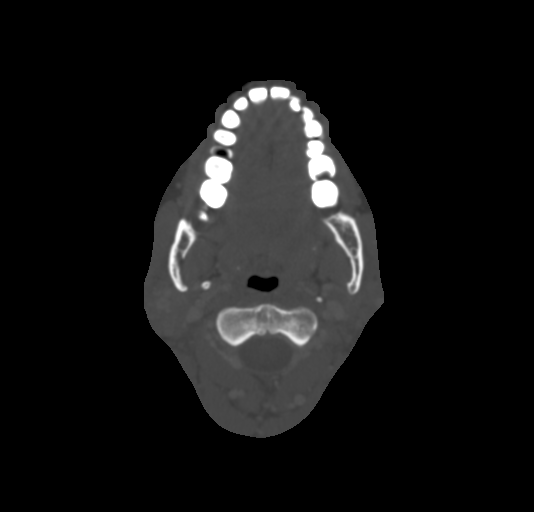

[5 of 14 positions shown; findings below may reference images not displayed]

FINDINGS: Pharynx and larynx: Bilateral palatine tonsillar calcifications. No
evidence of mass or swelling. Patent airway. No retropharyngeal
fluid.

Salivary glands: There is a 1 mm calcification in the anteroinferior
aspect of the left submandibular gland. The left submandibular duct
is minimally prominent in the superior aspect of the gland without
ductal dilatation or calculi identified in the floor of mouth. No
significant submandibular space inflammatory changes are evident.
The right submandibular gland and both parotid glands are
unremarkable.

Thyroid: Unremarkable.

Lymph nodes: No enlarged or suspicious lymph nodes in the neck.

Vascular: Major vascular structures of the neck are patent.

Limited intracranial: Unremarkable.

Visualized orbits: Unremarkable.

Mastoids and visualized paranasal sinuses: Small right maxillary
sinus with evidence of chronic sinusitis including mild mucosal
thickening currently. Clear mastoid air cells.

Skeleton: Multiple dental caries. Mild cervical spondylosis.

Upper chest: Biapical pleuroparenchymal scarring.

Other: None.
IMPRESSION: 1 mm left submandibular gland sialolith without evidence of acute
sialadenitis.

## 2020-02-01 ENCOUNTER — Ambulatory Visit: Payer: Self-pay | Attending: Internal Medicine

## 2020-02-01 DIAGNOSIS — Z23 Encounter for immunization: Secondary | ICD-10-CM

## 2020-02-01 NOTE — Progress Notes (Signed)
   Covid-19 Vaccination Clinic  Name:  Chayenne Straley    MRN: [id number] DOB: 19-Feb-1976  02/01/2020  Ms. Laurance was observed post Covid-19 immunization for 15 minutes without incident. She was provided with Vaccine Information Sheet and instruction to access the V-Safe system.   Ms. Gotto was instructed to call 911 with any severe reactions post vaccine: Marland Kitchen Difficulty breathing  . Swelling of face and throat  . A fast heartbeat  . A bad rash all over body  . Dizziness and weakness   Immunizations Administered    Name Date Dose VIS Date Route   Pfizer COVID-19 Vaccine 02/01/2020  2:29 PM 0.3 mL 10/20/2019 Intramuscular   Manufacturer: [place]   Lot: [id number]   [place]: [id number]

## 2020-02-26 ENCOUNTER — Ambulatory Visit: Payer: Self-pay | Attending: Internal Medicine

## 2020-02-26 DIAGNOSIS — Z23 Encounter for immunization: Secondary | ICD-10-CM

## 2020-02-26 NOTE — Progress Notes (Signed)
   Covid-19 Vaccination Clinic  Name:  Cenedra Sperle    MRN: [id number] DOB: [birth year]  02/26/2020  Ms. Mulready was observed post Covid-19 immunization for 15 minutes without incident. She was provided with Vaccine Information Sheet and instruction to access the V-Safe system.   Ms. Brieske was instructed to call 911 with any severe reactions post vaccine: Marland Kitchen Difficulty breathing  . Swelling of face and throat  . A fast heartbeat  . A bad rash all over body  . Dizziness and weakness   Immunizations Administered    Name Date Dose VIS Date Route   Pfizer COVID-19 Vaccine 02/26/2020  2:02 PM 0.3 mL 01/03/2019 Intramuscular   Manufacturer: [place]   Lot: [id number]   [place]: [id number]

## 2020-08-19 ENCOUNTER — Telehealth: Payer: Self-pay | Admitting: General Practice

## 2020-08-19 NOTE — Telephone Encounter (Signed)
Unable to LVM to schedule new patient appointment.

## 2021-05-15 ENCOUNTER — Other Ambulatory Visit: Payer: Self-pay | Admitting: Gastroenterology

## 2021-05-15 DIAGNOSIS — K625 Hemorrhage of anus and rectum: Secondary | ICD-10-CM

## 2021-05-15 DIAGNOSIS — R1032 Left lower quadrant pain: Secondary | ICD-10-CM

## 2021-05-27 ENCOUNTER — Other Ambulatory Visit: Payer: Self-pay

## 2021-05-27 ENCOUNTER — Ambulatory Visit
Admission: RE | Admit: 2021-05-27 | Discharge: 2021-05-27 | Disposition: A | Discharge status: Home / Self Care | Payer: 59 | Source: Ambulatory Visit | Attending: Gastroenterology | Admitting: Gastroenterology

## 2021-05-27 DIAGNOSIS — R1032 Left lower quadrant pain: Secondary | ICD-10-CM

## 2021-05-27 DIAGNOSIS — K625 Hemorrhage of anus and rectum: Secondary | ICD-10-CM

## 2021-05-27 MED ORDER — IOPAMIDOL (ISOVUE-300) INJECTION 61%
100.0000 mL | Freq: Once | INTRAVENOUS | Status: AC | PRN
  Administered 2021-05-27: 100 mL via INTRAVENOUS
